# Patient Record
Sex: Male | Born: 2000
Health system: Southern US, Community
[De-identification: ages and names within clinical notes are randomized; demographics above are authoritative.]

---
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
## 2021-01-01 ENCOUNTER — Other Ambulatory Visit: Payer: Self-pay

## 2021-01-01 ENCOUNTER — Encounter: Payer: Self-pay | Admitting: Orthopaedic Surgery

## 2021-01-01 ENCOUNTER — Ambulatory Visit: Payer: BC Managed Care – PPO | Admitting: Orthopaedic Surgery

## 2021-01-01 VITALS — BP 114/68 | HR 61 | Ht 72.0 in | Wt 180.0 lb

## 2021-01-01 DIAGNOSIS — M4726 Other spondylosis with radiculopathy, lumbar region: Secondary | ICD-10-CM

## 2021-01-01 NOTE — Progress Notes (Signed)
Office Visit Note   Patient: Keith Chaney           Date of Birth: 11/19/2000           MRN: [id number] Visit Date: 01/01/2021              Requested by: No referring provider defined for this encounter. PCP: Pcp, No   Assessment & Plan: Visit Diagnoses:  1. Other spondylosis with radiculopathy, lumbar region     Plan: We will send patient to physical therapy he can get a few visits in before he goes back to [employer].  He can take with him some home exercises sheets that he can work on when he is back at college.  We discussed slow gradual ramp of activities.  With normal bone scan no further work-up is required at this point and we can gradually slowly resume activities I think he would be able to tolerate this.  If he has increased problems he can return on break for repeat exam.  Outlined plan discussed with patient's mother.  Follow-Up Instructions: Return if symptoms worsen or fail to improve.   Orders:  No orders of the defined types were placed in this encounter.  No orders of the defined types were placed in this encounter.     Procedures: No procedures performed   Clinical Data: No additional findings.   Subjective: Chief Complaint  Patient presents with   Lower Back - Follow-up    HPI 20 year old male returns for ongoing problems with back discomfort.  Past history of pars stress fracture on the right at L5.  MRI scan done at [facility] scanner August 2020 showed apparent right L5 unilateral pars fracture with sclerosis.  CT scan showed ill-defined lucency through the right pars with sclerotic changes question healing fracture versus stress fracture.  Patient had bone scan which is available for review and was entirely normal.  Review of Systems all other systems are negative.   Objective: Vital Signs: BP 114/68   Pulse 61   Ht 6' (1.829 m)   Wt 180 lb (81.6 kg)   BMI 24.41 kg/m   Physical Exam Constitutional:      Appearance: He is  well-developed.  HENT:     Head: Normocephalic and atraumatic.     Right Ear: External ear normal.     Left Ear: External ear normal.  Eyes:     Pupils: Pupils are equal, round, and reactive to light.  Neck:     Thyroid: No thyromegaly.     Trachea: No tracheal deviation.  Cardiovascular:     Rate and Rhythm: Normal rate.  Pulmonary:     Effort: Pulmonary effort is normal.     Breath sounds: No wheezing.  Abdominal:     General: Bowel sounds are normal.     Palpations: Abdomen is soft.  Musculoskeletal:     Cervical back: Neck supple.  Skin:    General: Skin is warm and dry.     Capillary Refill: Capillary refill takes less than 2 seconds.  Neurological:     Mental Status: He is alert and oriented to person, place, and time.  Psychiatric:        Behavior: Behavior normal.        Thought Content: Thought content normal.        Judgment: Judgment normal.    Ortho Exam negative straight leg raising.  He gets sitting standing he can bend and extend lumbar spine.  No  trochanteric bursal tenderness negative logroll to the hips no atrophy of the lower extremities quads or calf or anterior compartment.  Specialty Comments:  No specialty comments available.  Imaging: Narrative & Impression  CLINICAL DATA:  History of right L5 pars stress fracture. Low back pain x2 years.   EXAM: NUCLEAR MEDICINE WHOLE BODY BONE SCAN   TECHNIQUE: Whole body anterior and posterior images were obtained approximately 3 hours after intravenous injection of radiopharmaceutical.   RADIOPHARMACEUTICALS:  19.2 millicurie mCi Technetium-8m MDP IV   COMPARISON:  CT scan of the lumbar spine 11/13/2019   FINDINGS: Normal radiotracer uptake throughout the axial and appendicular skeleton. No focally increased radiotracer uptake to suggest increased blastic activity or injury. Normal renal uptake and excretion.   IMPRESSION: Normal exam. No evidence of increased radiotracer activity within the  lumbar spine.     Electronically Signed   By: Malachy Moan M.D.   On: 12/19/2020 09:07       PMFS History: Patient Active Problem List   Diagnosis Date Noted   Stress fracture of lumbar vertebra 12/07/2020   Other spondylosis with radiculopathy, lumbar region 12/06/2020   History reviewed. No pertinent past medical history.  Family History  Problem Relation Age of Onset   Hypertension Father    Gout Father    Kidney disease Maternal Grandmother    Stroke Maternal Grandmother    Aneurysm Maternal Grandmother    Hypertension Maternal Grandmother    Hyperlipidemia Maternal Grandmother    Hypertension Maternal Grandfather    Diabetes Mellitus II Maternal Grandfather    Hypertension Paternal Grandmother    Cancer Paternal Grandmother        breast   Heart disease Paternal Grandmother    Parkinson's disease Paternal Grandfather     History reviewed. No pertinent surgical history. Social History   Occupational History   Not on file  Tobacco Use   Smoking status: Never   Smokeless tobacco: Never  Substance and Sexual Activity   Alcohol use: Never   Drug use: Never   Sexual activity: Not on file

## 2021-01-04 DIAGNOSIS — M545 Low back pain, unspecified: Secondary | ICD-10-CM | POA: Diagnosis not present

## 2021-05-30 DIAGNOSIS — L700 Acne vulgaris: Secondary | ICD-10-CM | POA: Diagnosis not present

## 2021-05-30 DIAGNOSIS — D2272 Melanocytic nevi of left lower limb, including hip: Secondary | ICD-10-CM | POA: Diagnosis not present

## 2021-05-30 DIAGNOSIS — D225 Melanocytic nevi of trunk: Secondary | ICD-10-CM | POA: Diagnosis not present

## 2021-05-30 DIAGNOSIS — D2239 Melanocytic nevi of other parts of face: Secondary | ICD-10-CM | POA: Diagnosis not present

## 2021-06-11 DIAGNOSIS — R636 Underweight: Secondary | ICD-10-CM | POA: Diagnosis not present

## 2021-06-11 DIAGNOSIS — Z0000 Encounter for general adult medical examination without abnormal findings: Secondary | ICD-10-CM | POA: Diagnosis not present

## 2021-06-11 DIAGNOSIS — R319 Hematuria, unspecified: Secondary | ICD-10-CM | POA: Diagnosis not present

## 2021-11-05 DIAGNOSIS — M25511 Pain in right shoulder: Secondary | ICD-10-CM | POA: Diagnosis not present

## 2021-12-04 DIAGNOSIS — R0781 Pleurodynia: Secondary | ICD-10-CM | POA: Diagnosis not present

## 2021-12-04 DIAGNOSIS — L700 Acne vulgaris: Secondary | ICD-10-CM | POA: Diagnosis not present

## 2021-12-09 IMAGING — CT CT L SPINE W/O CM
3 of 5 series · 10 of 33 positions shown, 11 images · non-contrast
Comparison: Prior MR imaging is not available in the PACS system at
the time of interpretation.

CLINICAL DATA: Low back pain, weight lifting injury

EXAM:
CT LUMBAR SPINE WITHOUT CONTRAST
TECHNIQUE: Multidetector CT imaging of the lumbar spine was performed without
intravenous contrast administration. Multiplanar CT image
reconstructions were also generated.

[Series 3: l-spine 2.00 br40 s3 lspine st · axial · 0.29mm/px · z∈[+1291,+1431]mm · 2 of 142 slices shown, 3 images]
[im 36/142  soft-tissue]
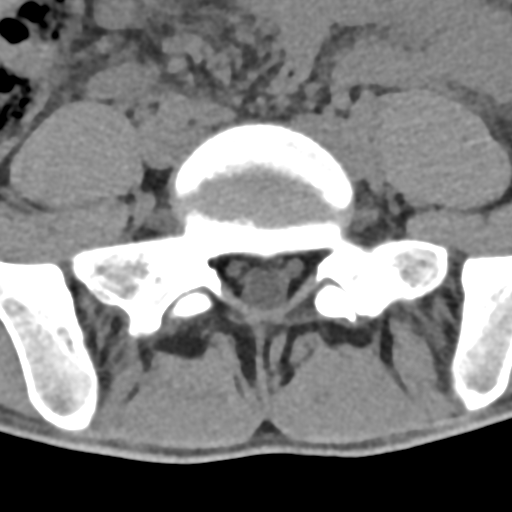
[im 36/142  bone]
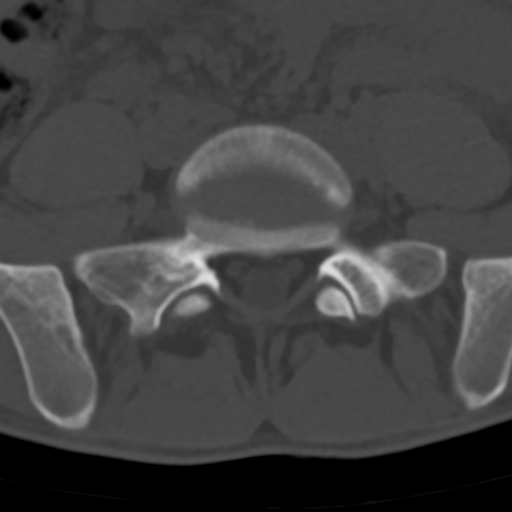
[im 106/142  bone]
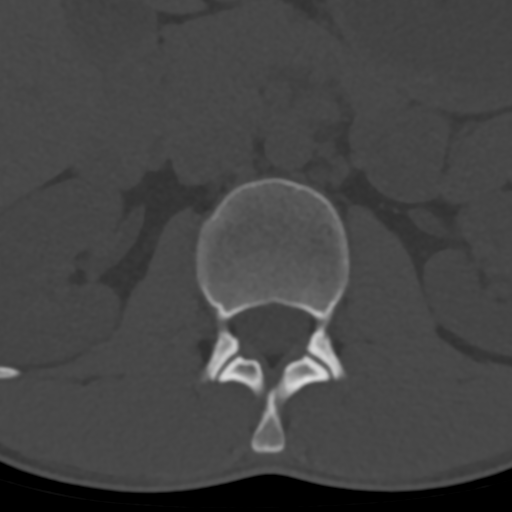

[Series 5: l-spine 2.00 br60 s3 sag bone · sagittal · 0.29mm/px · 5 of 74 slices shown]
[im 25/74  bone]
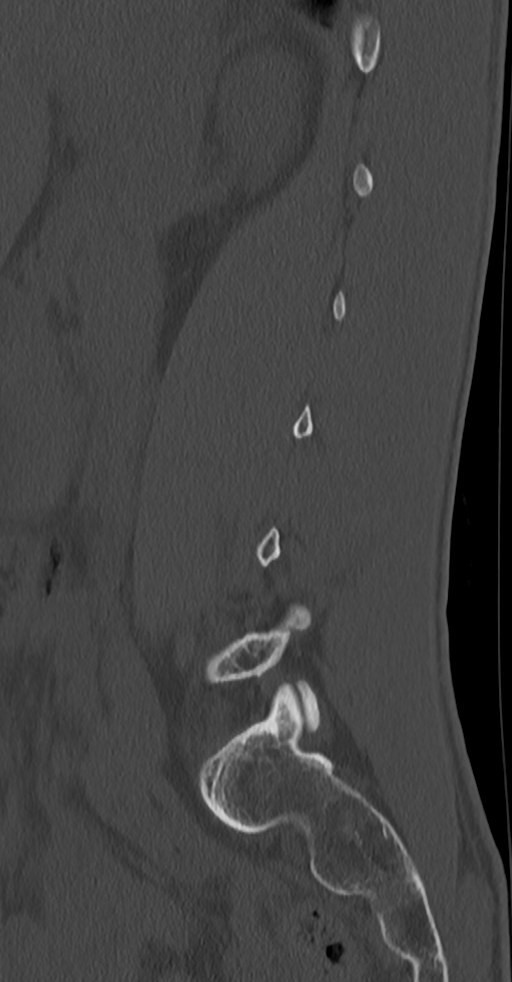
[im 31/74  bone]
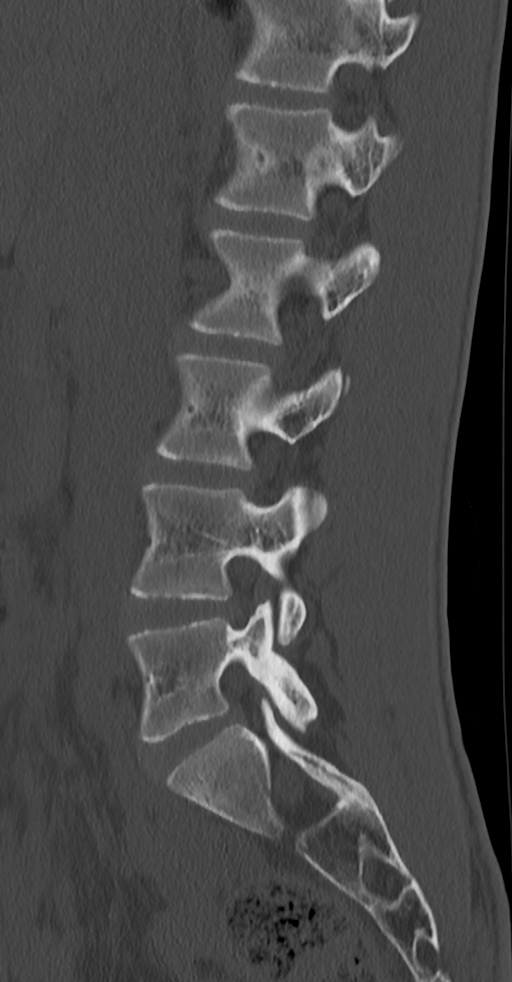
[im 37/74  bone]
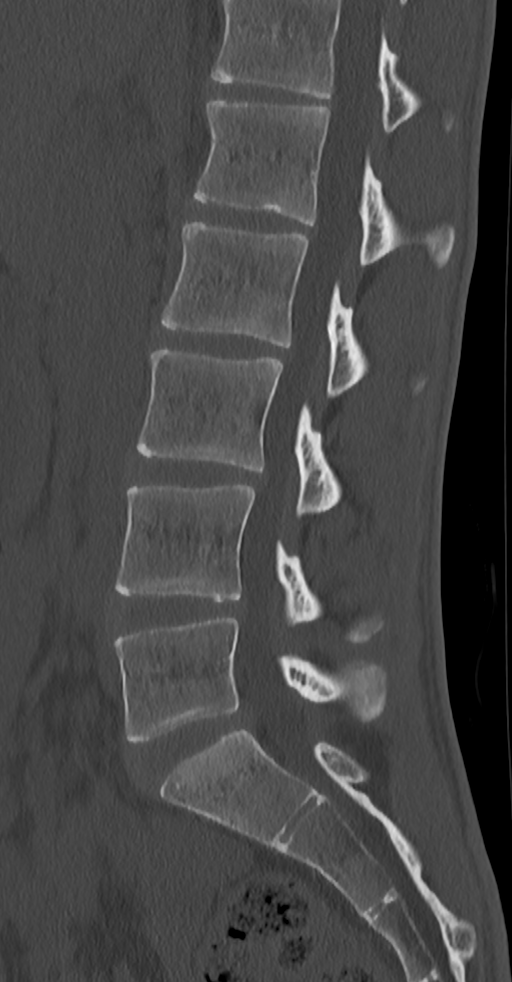
[im 43/74  bone]
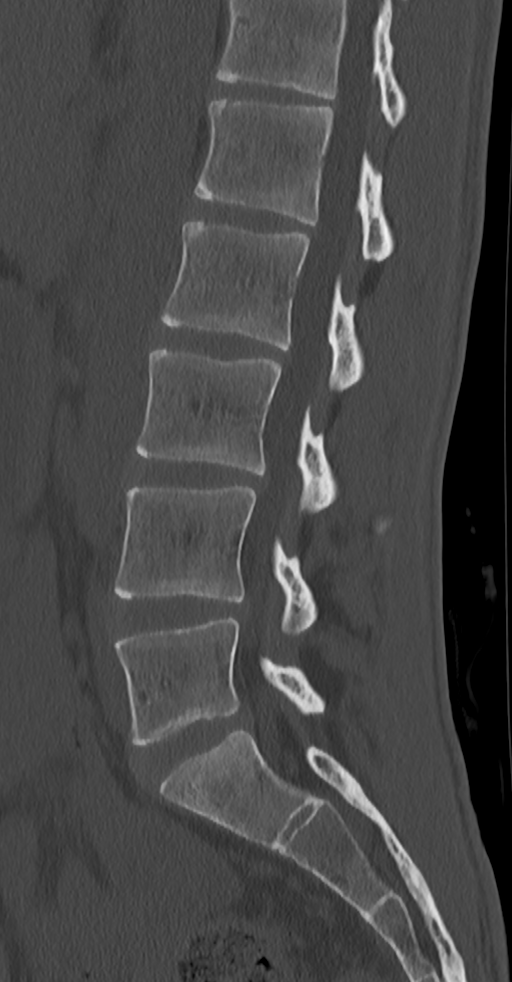
[im 49/74  bone]
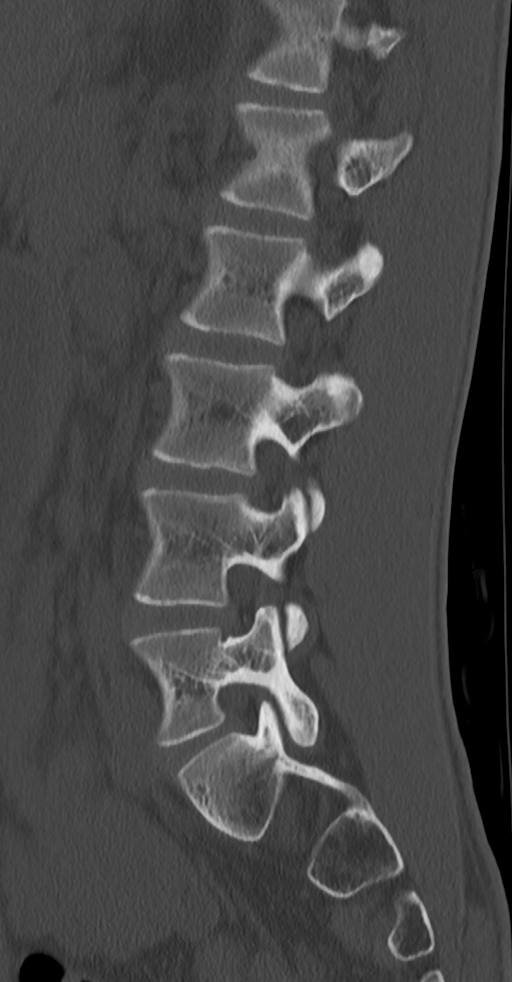

[Series 7: l-spine 2.00 br60 s3 cor bone · coronal · 0.29mm/px · 3 of 74 slices shown]
[im 15/74  bone]
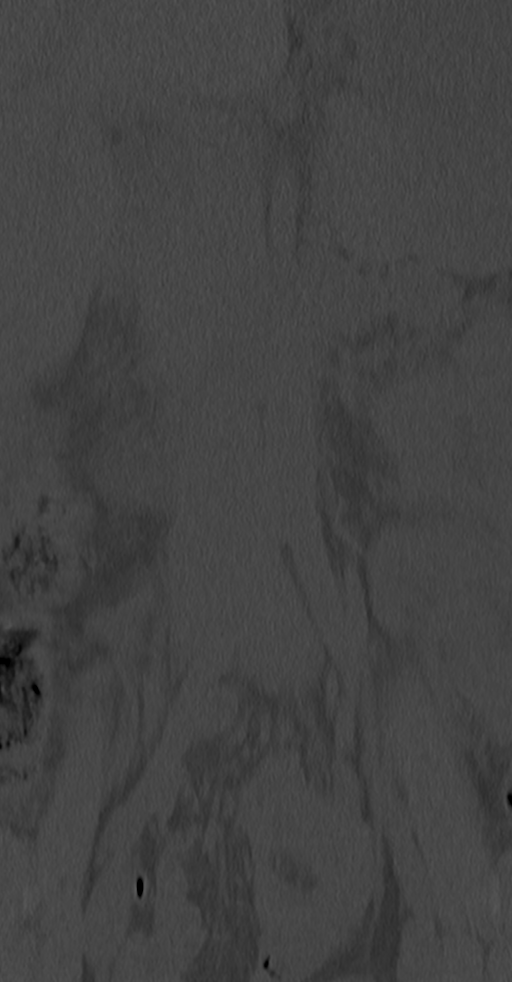
[im 30/74  bone]
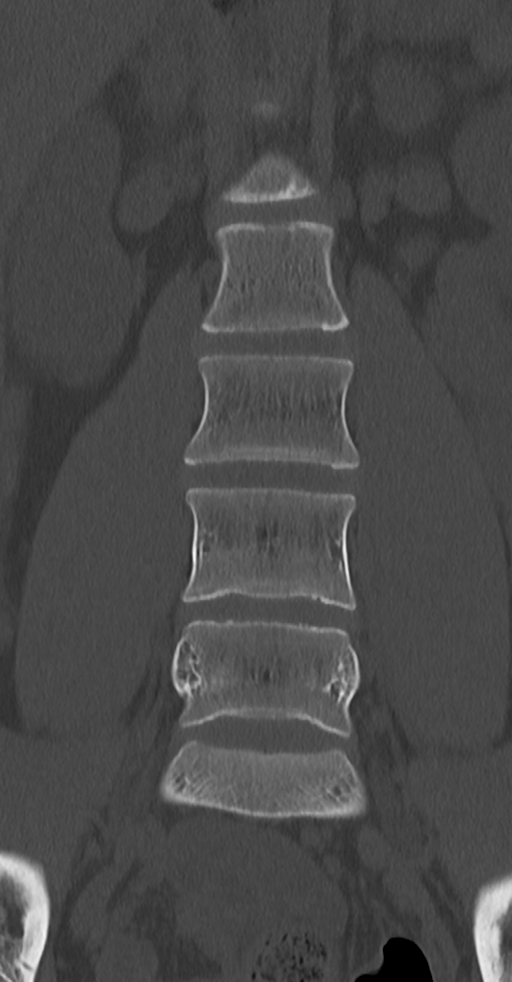
[im 44/74  bone]
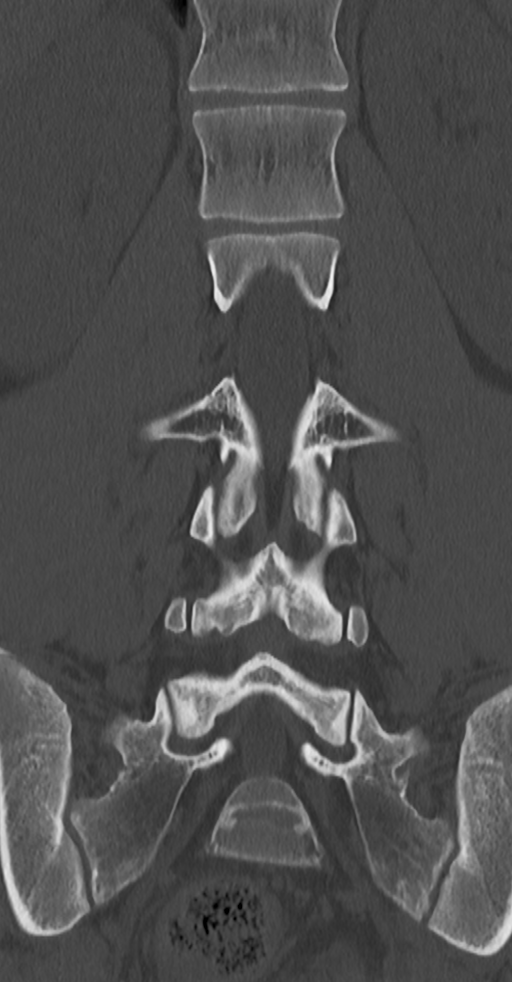

[10 of 33 positions shown; findings below may reference images not displayed]

FINDINGS: Segmentation: 5 normally formed lumbar type vertebral bodies.

Alignment: Minimal straightening of the normal lumbar lordosis.
Possibly developmental, positional or related to muscle spasm.

Vertebrae: There is an ill-defined linear lucency through the right
L5 pars interarticularis with some associated cortical thickening.
Could reflect a nondisplaced healing fracture versus stress
fracture. No other acute osseous injury or vertebral body height
loss. Included portions of bony pelvis are intact and aligned. Mild
sclerotic change of the S1 superior endplate could reflect some
early Modic type endplate change. Small Schmorl's node formation
noted in the inferior endplates T12, L1.

Paraspinal and other soft tissues: No paravertebral fluid, swelling,
gas or hemorrhage. No visible canal hematoma. Included portions of
the posterior abdomen and pelvis are unremarkable.

Disc levels:

Level by level evaluation of the lumbar spine below:

T12-L1: Mild disc height loss and early discogenic change with
Schmorl's node formations. No significant posterior disc
abnormality. No spinal canal or foraminal stenosis.

L1-L2: Mild disc height loss with early discogenic change and
Schmorl's node formation. No significant posterior disc abnormality.
No spinal canal or foraminal stenosis.

L2-L3: Mild disc height loss and small global disc bulge. No
significant canal stenosis or resulting foraminal narrowing.

L3-L4: Mild disc height loss and small global disc bulge. No
significant canal stenosis or resulting foraminal narrowing.

L4-L5: Mild disc height loss and global disc bulge resulting in at
most mild canal stenosis. No significant foraminal impingement.

L5-S1: Mild disc height loss and global disc bulge. No significant
resulting canal stenosis or foraminal impingement.
IMPRESSION: 1. Ill-defined linear lucency through the right L5 pars
interarticularis with some associated cortical thickening. Could
reflect a nondisplaced healing fracture versus stress fracture. MR
comparison is unavailable at the time of interpretation. Correlate
exam findings and prior MR report if available.
2. Some mild multilevel disc height loss and early disc bulging
changes of the lumbar spine as described above, with at most mild
canal stenosis at L4-L5. No significant foraminal impingement.
3. Mild sclerotic change of the S1 superior endplate could reflect
some early Modic type endplate change.

## 2021-12-17 DIAGNOSIS — M25511 Pain in right shoulder: Secondary | ICD-10-CM | POA: Diagnosis not present

## 2022-06-11 DIAGNOSIS — Z23 Encounter for immunization: Secondary | ICD-10-CM | POA: Diagnosis not present

## 2022-06-11 DIAGNOSIS — R319 Hematuria, unspecified: Secondary | ICD-10-CM | POA: Diagnosis not present

## 2022-06-11 DIAGNOSIS — D229 Melanocytic nevi, unspecified: Secondary | ICD-10-CM | POA: Diagnosis not present

## 2022-06-11 DIAGNOSIS — D2272 Melanocytic nevi of left lower limb, including hip: Secondary | ICD-10-CM | POA: Diagnosis not present

## 2022-06-11 DIAGNOSIS — Z0000 Encounter for general adult medical examination without abnormal findings: Secondary | ICD-10-CM | POA: Diagnosis not present

## 2022-06-11 DIAGNOSIS — D2239 Melanocytic nevi of other parts of face: Secondary | ICD-10-CM | POA: Diagnosis not present

## 2022-06-11 DIAGNOSIS — L578 Other skin changes due to chronic exposure to nonionizing radiation: Secondary | ICD-10-CM | POA: Diagnosis not present

## 2022-12-14 IMAGING — NM NM BONE WHOLE BODY
2 series · 2 of 2 positions shown · non-contrast
Comparison: CT scan of the lumbar spine 11/13/2019

CLINICAL DATA: History of right L5 pars stress fracture. Low back
pain x2 years.

EXAM:
NUCLEAR MEDICINE WHOLE BODY BONE SCAN
TECHNIQUE: Whole body anterior and posterior images were obtained approximately
3 hours after intravenous injection of radiopharmaceutical.
RADIOPHARMACEUTICALS:  19.2 millicurie mCi 3echnetium-77m MDP IV

[Series 1: wbr_bone_40 whole body · 2.66mm/px · 1 of 1 slices shown (1 of 2)]
[im 1/1]
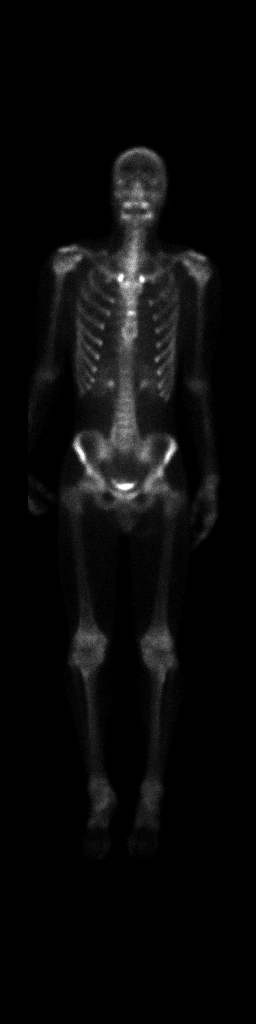

[Series 1: wbr_bone_40 whole body · 2.66mm/px · 1 of 1 slices shown (2 of 2)]
[im 1/1]
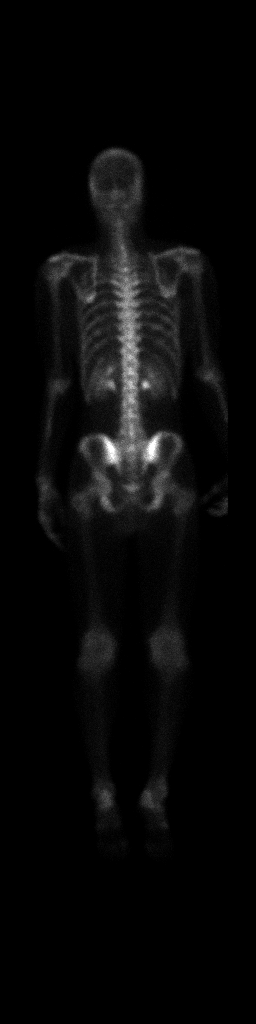

[2 of 2 positions shown; findings below may reference images not displayed]

FINDINGS: Normal radiotracer uptake throughout the axial and appendicular
skeleton. No focally increased radiotracer uptake to suggest
increased blastic activity or injury. Normal renal uptake and
excretion.
IMPRESSION: Normal exam. No evidence of increased radiotracer activity within
the lumbar spine.

## 2023-06-08 DIAGNOSIS — L578 Other skin changes due to chronic exposure to nonionizing radiation: Secondary | ICD-10-CM | POA: Diagnosis not present

## 2023-06-08 DIAGNOSIS — D485 Neoplasm of uncertain behavior of skin: Secondary | ICD-10-CM | POA: Diagnosis not present

## 2023-06-08 DIAGNOSIS — D229 Melanocytic nevi, unspecified: Secondary | ICD-10-CM | POA: Diagnosis not present

## 2023-06-08 DIAGNOSIS — D225 Melanocytic nevi of trunk: Secondary | ICD-10-CM | POA: Diagnosis not present

## 2023-06-08 DIAGNOSIS — L821 Other seborrheic keratosis: Secondary | ICD-10-CM | POA: Diagnosis not present

## 2023-06-08 DIAGNOSIS — L814 Other melanin hyperpigmentation: Secondary | ICD-10-CM | POA: Diagnosis not present

## 2023-06-12 DIAGNOSIS — Z0000 Encounter for general adult medical examination without abnormal findings: Secondary | ICD-10-CM | POA: Diagnosis not present

## 2024-01-21 DIAGNOSIS — H00015 Hordeolum externum left lower eyelid: Secondary | ICD-10-CM | POA: Diagnosis not present
